# Patient Record
Sex: Female | Born: 1935 | Race: White | Hispanic: No | Smoking: Never smoker
Health system: Southern US, Community
[De-identification: ages and names within clinical notes are randomized; demographics above are authoritative.]

## PROBLEM LIST (undated history)

## (undated) DIAGNOSIS — E785 Hyperlipidemia, unspecified: Secondary | ICD-10-CM

## (undated) DIAGNOSIS — I4891 Unspecified atrial fibrillation: Secondary | ICD-10-CM

## (undated) DIAGNOSIS — I259 Chronic ischemic heart disease, unspecified: Secondary | ICD-10-CM

## (undated) DIAGNOSIS — I10 Essential (primary) hypertension: Secondary | ICD-10-CM

## (undated) DIAGNOSIS — F0390 Unspecified dementia without behavioral disturbance: Secondary | ICD-10-CM

## (undated) DIAGNOSIS — R531 Weakness: Secondary | ICD-10-CM

## (undated) DIAGNOSIS — F32A Depression, unspecified: Secondary | ICD-10-CM

## (undated) DIAGNOSIS — J9600 Acute respiratory failure, unspecified whether with hypoxia or hypercapnia: Secondary | ICD-10-CM

## (undated) DIAGNOSIS — N289 Disorder of kidney and ureter, unspecified: Secondary | ICD-10-CM

## (undated) DIAGNOSIS — K5900 Constipation, unspecified: Secondary | ICD-10-CM

## (undated) DIAGNOSIS — J40 Bronchitis, not specified as acute or chronic: Secondary | ICD-10-CM

## (undated) DIAGNOSIS — R609 Edema, unspecified: Secondary | ICD-10-CM

## (undated) DIAGNOSIS — R1310 Dysphagia, unspecified: Secondary | ICD-10-CM

## (undated) DIAGNOSIS — D649 Anemia, unspecified: Secondary | ICD-10-CM

## (undated) DIAGNOSIS — F329 Major depressive disorder, single episode, unspecified: Secondary | ICD-10-CM

---

## 2015-01-24 ENCOUNTER — Encounter (HOSPITAL_COMMUNITY): Payer: Self-pay | Admitting: Cardiology

## 2015-01-24 ENCOUNTER — Inpatient Hospital Stay (HOSPITAL_COMMUNITY)
Admission: EM | Admit: 2015-01-24 | Discharge: 2015-01-26 | DRG: 193 | Disposition: A | Discharge status: Skilled Nursing Facility | Payer: Medicare Other | Attending: Internal Medicine | Admitting: Internal Medicine

## 2015-01-24 ENCOUNTER — Emergency Department (HOSPITAL_COMMUNITY): Payer: Medicare Other

## 2015-01-24 DIAGNOSIS — E1122 Type 2 diabetes mellitus with diabetic chronic kidney disease: Secondary | ICD-10-CM | POA: Diagnosis present

## 2015-01-24 DIAGNOSIS — J189 Pneumonia, unspecified organism: Secondary | ICD-10-CM | POA: Diagnosis not present

## 2015-01-24 DIAGNOSIS — E785 Hyperlipidemia, unspecified: Secondary | ICD-10-CM | POA: Diagnosis present

## 2015-01-24 DIAGNOSIS — I129 Hypertensive chronic kidney disease with stage 1 through stage 4 chronic kidney disease, or unspecified chronic kidney disease: Secondary | ICD-10-CM | POA: Diagnosis present

## 2015-01-24 DIAGNOSIS — K921 Melena: Secondary | ICD-10-CM | POA: Diagnosis present

## 2015-01-24 DIAGNOSIS — Z993 Dependence on wheelchair: Secondary | ICD-10-CM | POA: Diagnosis not present

## 2015-01-24 DIAGNOSIS — N183 Chronic kidney disease, stage 3 (moderate): Secondary | ICD-10-CM | POA: Diagnosis present

## 2015-01-24 DIAGNOSIS — G9340 Encephalopathy, unspecified: Secondary | ICD-10-CM | POA: Diagnosis present

## 2015-01-24 DIAGNOSIS — Z681 Body mass index (BMI) 19 or less, adult: Secondary | ICD-10-CM

## 2015-01-24 DIAGNOSIS — N179 Acute kidney failure, unspecified: Secondary | ICD-10-CM | POA: Diagnosis not present

## 2015-01-24 DIAGNOSIS — N189 Chronic kidney disease, unspecified: Secondary | ICD-10-CM

## 2015-01-24 DIAGNOSIS — R32 Unspecified urinary incontinence: Secondary | ICD-10-CM | POA: Diagnosis present

## 2015-01-24 DIAGNOSIS — N390 Urinary tract infection, site not specified: Secondary | ICD-10-CM | POA: Diagnosis present

## 2015-01-24 DIAGNOSIS — E860 Dehydration: Secondary | ICD-10-CM | POA: Diagnosis present

## 2015-01-24 DIAGNOSIS — D649 Anemia, unspecified: Secondary | ICD-10-CM | POA: Insufficient documentation

## 2015-01-24 DIAGNOSIS — F0390 Unspecified dementia without behavioral disturbance: Secondary | ICD-10-CM | POA: Diagnosis present

## 2015-01-24 DIAGNOSIS — I4891 Unspecified atrial fibrillation: Secondary | ICD-10-CM | POA: Diagnosis present

## 2015-01-24 DIAGNOSIS — Z66 Do not resuscitate: Secondary | ICD-10-CM | POA: Diagnosis present

## 2015-01-24 DIAGNOSIS — Y95 Nosocomial condition: Secondary | ICD-10-CM | POA: Diagnosis present

## 2015-01-24 DIAGNOSIS — D631 Anemia in chronic kidney disease: Secondary | ICD-10-CM | POA: Insufficient documentation

## 2015-01-24 DIAGNOSIS — R410 Disorientation, unspecified: Secondary | ICD-10-CM

## 2015-01-24 DIAGNOSIS — N289 Disorder of kidney and ureter, unspecified: Secondary | ICD-10-CM

## 2015-01-24 DIAGNOSIS — E875 Hyperkalemia: Secondary | ICD-10-CM | POA: Diagnosis present

## 2015-01-24 DIAGNOSIS — N182 Chronic kidney disease, stage 2 (mild): Secondary | ICD-10-CM | POA: Diagnosis present

## 2015-01-24 DIAGNOSIS — G9341 Metabolic encephalopathy: Secondary | ICD-10-CM | POA: Diagnosis present

## 2015-01-24 DIAGNOSIS — E440 Moderate protein-calorie malnutrition: Secondary | ICD-10-CM | POA: Diagnosis present

## 2015-01-24 DIAGNOSIS — I482 Chronic atrial fibrillation, unspecified: Secondary | ICD-10-CM

## 2015-01-24 DIAGNOSIS — D696 Thrombocytopenia, unspecified: Secondary | ICD-10-CM | POA: Diagnosis present

## 2015-01-24 DIAGNOSIS — J9601 Acute respiratory failure with hypoxia: Secondary | ICD-10-CM | POA: Diagnosis present

## 2015-01-24 DIAGNOSIS — I10 Essential (primary) hypertension: Secondary | ICD-10-CM | POA: Diagnosis present

## 2015-01-24 HISTORY — DX: Essential (primary) hypertension: I10

## 2015-01-24 HISTORY — DX: Acute respiratory failure, unspecified whether with hypoxia or hypercapnia: J96.00

## 2015-01-24 HISTORY — DX: Chronic ischemic heart disease, unspecified: I25.9

## 2015-01-24 HISTORY — DX: Anemia, unspecified: D64.9

## 2015-01-24 HISTORY — DX: Weakness: R53.1

## 2015-01-24 HISTORY — DX: Dysphagia, unspecified: R13.10

## 2015-01-24 HISTORY — DX: Hyperlipidemia, unspecified: E78.5

## 2015-01-24 HISTORY — DX: Major depressive disorder, single episode, unspecified: F32.9

## 2015-01-24 HISTORY — DX: Depression, unspecified: F32.A

## 2015-01-24 HISTORY — DX: Unspecified atrial fibrillation: I48.91

## 2015-01-24 HISTORY — DX: Edema, unspecified: R60.9

## 2015-01-24 HISTORY — DX: Constipation, unspecified: K59.00

## 2015-01-24 HISTORY — DX: Disorder of kidney and ureter, unspecified: N28.9

## 2015-01-24 HISTORY — DX: Unspecified dementia, unspecified severity, without behavioral disturbance, psychotic disturbance, mood disturbance, and anxiety: F03.90

## 2015-01-24 HISTORY — DX: Bronchitis, not specified as acute or chronic: J40

## 2015-01-24 LAB — CBC WITH DIFFERENTIAL/PLATELET
BASOS PCT: 0 %
Basophils Absolute: 0 10*3/uL (ref 0.0–0.1)
EOS ABS: 0.2 10*3/uL (ref 0.0–0.7)
EOS PCT: 4 %
HCT: 44.8 % (ref 36.0–46.0)
Hemoglobin: 14.5 g/dL (ref 12.0–15.0)
Lymphocytes Relative: 18 %
Lymphs Abs: 0.8 10*3/uL (ref 0.7–4.0)
MCH: 32.1 pg (ref 26.0–34.0)
MCHC: 32.4 g/dL (ref 30.0–36.0)
MCV: 99.1 fL (ref 78.0–100.0)
MONO ABS: 0.5 10*3/uL (ref 0.1–1.0)
MONOS PCT: 11 %
Neutro Abs: 3.1 10*3/uL (ref 1.7–7.7)
Neutrophils Relative %: 68 %
Platelets: 98 10*3/uL — ABNORMAL LOW (ref 150–400)
RBC: 4.52 MIL/uL (ref 3.87–5.11)
RDW: 15.5 % (ref 11.5–15.5)
WBC: 4.6 10*3/uL (ref 4.0–10.5)

## 2015-01-24 LAB — COMPREHENSIVE METABOLIC PANEL
ALBUMIN: 3.3 g/dL — AB (ref 3.5–5.0)
ALT: 23 U/L (ref 14–54)
ANION GAP: 9 (ref 5–15)
AST: 37 U/L (ref 15–41)
Alkaline Phosphatase: 102 U/L (ref 38–126)
BUN: 49 mg/dL — ABNORMAL HIGH (ref 6–20)
CO2: 20 mmol/L — AB (ref 22–32)
Calcium: 8.7 mg/dL — ABNORMAL LOW (ref 8.9–10.3)
Chloride: 110 mmol/L (ref 101–111)
Creatinine, Ser: 1.79 mg/dL — ABNORMAL HIGH (ref 0.44–1.00)
GFR calc non Af Amer: 23 mL/min — ABNORMAL LOW (ref >60)
GFR, EST AFRICAN AMERICAN: 26 mL/min — AB (ref >60)
GLUCOSE: 94 mg/dL (ref 65–99)
POTASSIUM: 5.2 mmol/L — AB (ref 3.5–5.1)
SODIUM: 139 mmol/L (ref 135–145)
TOTAL PROTEIN: 7.1 g/dL (ref 6.5–8.1)
Total Bilirubin: 1.1 mg/dL (ref 0.3–1.2)

## 2015-01-24 LAB — URINALYSIS, ROUTINE W REFLEX MICROSCOPIC
BILIRUBIN URINE: NEGATIVE
Glucose, UA: NEGATIVE mg/dL
NITRITE: POSITIVE — AB
PH: 5.5 (ref 5.0–8.0)
Protein, ur: 30 mg/dL — AB
Specific Gravity, Urine: 1.025 (ref 1.005–1.030)
Urobilinogen, UA: 0.2 mg/dL (ref 0.0–1.0)

## 2015-01-24 LAB — TYPE AND SCREEN
ABO/RH(D): O NEG
ANTIBODY SCREEN: NEGATIVE

## 2015-01-24 LAB — URINE MICROSCOPIC-ADD ON

## 2015-01-24 LAB — I-STAT CG4 LACTIC ACID, ED: LACTIC ACID, VENOUS: 1.62 mmol/L (ref 0.5–2.0)

## 2015-01-24 LAB — TSH: TSH: 1.392 u[IU]/mL (ref 0.350–4.500)

## 2015-01-24 LAB — LACTIC ACID, PLASMA: Lactic Acid, Venous: 2.2 mmol/L (ref 0.5–2.0)

## 2015-01-24 LAB — POC OCCULT BLOOD, ED: Fecal Occult Bld: NEGATIVE

## 2015-01-24 MED ORDER — SODIUM CHLORIDE 0.9 % IV BOLUS (SEPSIS): 1000.0000 mL | Freq: Once | INTRAVENOUS | Status: DC

## 2015-01-24 MED ORDER — ACETAMINOPHEN 325 MG PO TABS: 650.0000 mg | ORAL_TABLET | Freq: Four times a day (QID) | ORAL | Status: DC | PRN

## 2015-01-24 MED ORDER — VANCOMYCIN HCL 500 MG IV SOLR
500.0000 mg | INTRAVENOUS | Status: DC
  Filled 2015-01-24: qty 500

## 2015-01-24 MED ORDER — VANCOMYCIN HCL IN DEXTROSE 750-5 MG/150ML-% IV SOLN
750.0000 mg | Freq: Once | INTRAVENOUS | Status: AC
  Administered 2015-01-24: 750 mg via INTRAVENOUS
  Filled 2015-01-24: qty 150

## 2015-01-24 MED ORDER — MAGNESIUM CITRATE PO SOLN: 1.0000 | Freq: Once | ORAL | Status: DC | PRN

## 2015-01-24 MED ORDER — ONDANSETRON HCL 4 MG/2ML IJ SOLN: 4.0000 mg | Freq: Four times a day (QID) | INTRAMUSCULAR | Status: DC | PRN

## 2015-01-24 MED ORDER — BISACODYL 10 MG RE SUPP: 10.0000 mg | Freq: Every day | RECTAL | Status: DC | PRN

## 2015-01-24 MED ORDER — SODIUM CHLORIDE 0.9 % IV BOLUS (SEPSIS): 500.0000 mL | Freq: Once | INTRAVENOUS | Status: DC

## 2015-01-24 MED ORDER — ACETAMINOPHEN 650 MG RE SUPP: 650.0000 mg | Freq: Four times a day (QID) | RECTAL | Status: DC | PRN

## 2015-01-24 MED ORDER — SODIUM CHLORIDE 0.9 % IV BOLUS (SEPSIS)
500.0000 mL | Freq: Once | INTRAVENOUS | Status: AC
  Administered 2015-01-24: 500 mL via INTRAVENOUS

## 2015-01-24 MED ORDER — PIPERACILLIN-TAZOBACTAM IN DEX 2-0.25 GM/50ML IV SOLN
2.2500 g | Freq: Three times a day (TID) | INTRAVENOUS | Status: DC
Start: 2015-01-24 — End: 2015-01-25
  Administered 2015-01-24 – 2015-01-25 (×2): 2.25 g via INTRAVENOUS
  Filled 2015-01-24 (×6): qty 50

## 2015-01-24 MED ORDER — SODIUM CHLORIDE 0.9 % IV SOLN
INTRAVENOUS | Status: DC
  Administered 2015-01-24: 16:00:00 via INTRAVENOUS

## 2015-01-24 MED ORDER — METOPROLOL TARTRATE 1 MG/ML IV SOLN
5.0000 mg | Freq: Three times a day (TID) | INTRAVENOUS | Status: DC
  Administered 2015-01-24 – 2015-01-25 (×3): 5 mg via INTRAVENOUS
  Filled 2015-01-24 (×3): qty 5

## 2015-01-24 MED ORDER — ONDANSETRON HCL 4 MG PO TABS: 4.0000 mg | ORAL_TABLET | Freq: Four times a day (QID) | ORAL | Status: DC | PRN

## 2015-01-24 MED ORDER — ALBUTEROL SULFATE (2.5 MG/3ML) 0.083% IN NEBU: 2.5000 mg | INHALATION_SOLUTION | Freq: Four times a day (QID) | RESPIRATORY_TRACT | Status: DC | PRN

## 2015-01-24 MED ORDER — PIPERACILLIN-TAZOBACTAM 3.375 G IVPB 30 MIN
3.3750 g | Freq: Once | INTRAVENOUS | Status: AC
  Administered 2015-01-24: 3.375 g via INTRAVENOUS
  Filled 2015-01-24: qty 50

## 2015-01-24 NOTE — Progress Notes (Signed)
Patient not able answer admission questions. No family present at this time. Attempt to feed patient patient held food in mouth.

## 2015-01-24 NOTE — ED Notes (Addendum)
Vancomycin infusion completed.

## 2015-01-24 NOTE — ED Provider Notes (Signed)
Of the CSN: [id number]     Arrival date & time 01/24/15  1028 History  By signing my name below, I, Marica Otter, attest that this documentation has been prepared under the direction and in the presence of Estela Y Hernandez Acost*. Electronically Signed: Marica Otter, ED Scribe. 01/24/2015. 11:47 AM.   LEVEL 5 CAVEAT: DEMENTIA  Chief Complaint  Patient presents with  . GI Bleeding   The history is provided by the nursing home. No language interpreter was used.   PCP: No primary care provider on file. HPI Comments: Ashley Singh is a 81 y.o. female, with PMHx noted below, who presents to the Emergency Department from a complaining of hematuria this morning.   Past Medical History  Diagnosis Date  . Anemia   . Bronchitis   . Renal disorder   . Edema   . Hypertension   . Hyperlipidemia   . Depressive disorder   . Atrial fibrillation   . Ischemia of heart, chronic   . Constipation   . Dysphagia   . Dementia   . Weakness   . Acute respiratory failure    History reviewed. No pertinent past surgical history. History reviewed. No pertinent family history. Social History  Substance Use Topics  . Smoking status: None  . Smokeless tobacco: None  . Alcohol Use: No   OB History    No data available     Review of Systems  Unable to perform ROS: Mental status change   Allergies  Review of patient's allergies indicates no known allergies.  Home Medications   Prior to Admission medications   Not on File   Triage Vitals: BP 162/91 mmHg  Pulse 102  Temp(Src) 96.6 F (35.9 C) (Rectal)  Resp 16  Ht 5' (1.524 m)  Wt 95 lb (43.092 kg)  BMI 18.55 kg/m2  SpO2 86% Physical Exam  Constitutional: She appears well-developed. No distress.  HENT:  Head: Normocephalic.  Mouth/Throat: Mucous membranes are dry.  Eyes: EOM are normal.  Eyes tightly closed.   Neck: Normal range of motion. Neck supple.  Cardiovascular: Regular rhythm.  Tachycardia present.   Pulmonary/Chest:  Effort normal. She has no rales.  Some abd breathing. Decreased effort.   Abdominal: She exhibits no distension.  Musculoskeletal: She exhibits no edema.  Flexed at the legs and arms.   Neurological: GCS eye subscore is 3. GCS verbal subscore is 3. GCS motor subscore is 5.  Confused Minimal verbal, difficulty exam, good tone muscles  Skin: No erythema.  Psychiatric:  confused  Nursing note and vitals reviewed.   ED Course  Procedures (including critical care time) DIAGNOSTIC STUDIES: Oxygen Saturation is 86% on RA, low by my interpretation.    Labs Review Labs Reviewed  CBC WITH DIFFERENTIAL/PLATELET - Abnormal; Notable for the following:    Platelets 98 (*)    All other components within normal limits  COMPREHENSIVE METABOLIC PANEL - Abnormal; Notable for the following:    Potassium 5.2 (*)    CO2 20 (*)    BUN 49 (*)    Creatinine, Ser 1.79 (*)    Calcium 8.7 (*)    Albumin 3.3 (*)    GFR calc non Af Amer 23 (*)    GFR calc Af Amer 26 (*)    All other components within normal limits  URINALYSIS, ROUTINE W REFLEX MICROSCOPIC (NOT AT [facility]) - Abnormal; Notable for the following:    Hgb urine dipstick MODERATE (*)    Ketones, ur TRACE (*)  Protein, ur 30 (*)    Nitrite POSITIVE (*)    Leukocytes, UA TRACE (*)    All other components within normal limits  URINE MICROSCOPIC-ADD ON - Abnormal; Notable for the following:    Squamous Epithelial / LPF FEW (*)    Bacteria, UA MANY (*)    All other components within normal limits  LACTIC ACID, PLASMA - Abnormal; Notable for the following:    Lactic Acid, Venous 2.2 (*)    All other components within normal limits  URINE CULTURE  CULTURE, BLOOD (ROUTINE X 2)  CULTURE, BLOOD (ROUTINE X 2)  CULTURE, EXPECTORATED SPUTUM-ASSESSMENT  GRAM STAIN  URINE CULTURE  LEGIONELLA PNEUMOPHILA SEROGP 1 UR AG  STREP PNEUMONIAE URINARY ANTIGEN  TSH  POC OCCULT BLOOD, ED  POC OCCULT BLOOD, ED  I-STAT CG4 LACTIC ACID, ED  TYPE AND  SCREEN    Imaging Review Dg Chest Portable 1 View  01/24/2015   CLINICAL DATA:  Found down today. Dementia with GI bleeding and urinary tract infection. Initial encounter.  EXAM: PORTABLE CHEST 1 VIEW  COMPARISON:  None.  FINDINGS: 1157 hours. The lung bases are excluded from this portable examination. Patient is rotated to the right. The heart is enlarged and there is mild aortic atherosclerosis and ectasia. There are probable bilateral pleural effusions with asymmetric perihilar airspace disease on the right. No evidence of pneumothorax. The bones are demineralized without acute findings.  IMPRESSION: Asymmetric perihilar airspace disease on the right may reflect pneumonia or focal atelectasis. Suspected bilateral pleural effusions with associated basilar atelectasis and mild cardiomegaly. Followup PA and lateral chest X-ray is recommended in 3-4 weeks following trial of antibiotic therapy to ensure resolution and exclude underlying malignancy.   Electronically Signed   By: Carey Bullocks M.D.   On: 01/24/2015 12:11   I have personally reviewed and evaluated these images and lab results as part of my medical decision-making.   EKG Interpretation None      MDM   Final diagnoses:  Confusion  Blood in stool  Acute renal failure, unspecified acute renal failure type  HCAP (healthcare-associated pneumonia)   Patient presents with worsening confusion and blood from stool or urine. Patient had recent urine infection diagnosis. On arrival patient had periods of apnea and increased tachypnea. Concern for urine infection versus pneumonia. Chest x-ray reviewed by myself concerning for right-sided pneumonia. Hospital acquired antibody X. Urinalysis pending. Discussed with hospitalist for admission, patient is DO NOT RESUSCITATE. Patient on 3 L nasal cannula.  The patients results and plan were reviewed and discussed.   Any x-rays performed were independently reviewed by myself.   Differential  diagnosis were considered with the presenting HPI.  Medications  sodium chloride 0.9 % bolus 500 mL (0 mLs Intravenous Stopped 01/24/15 1347)  0.9 %  sodium chloride infusion (not administered)  acetaminophen (TYLENOL) tablet 650 mg (not administered)    Or  acetaminophen (TYLENOL) suppository 650 mg (not administered)  bisacodyl (DULCOLAX) suppository 10 mg (not administered)  magnesium citrate solution 1 Bottle (not administered)  ondansetron (ZOFRAN) tablet 4 mg (not administered)    Or  ondansetron (ZOFRAN) injection 4 mg (not administered)  metoprolol (LOPRESSOR) injection 5 mg (not administered)  piperacillin-tazobactam (ZOSYN) IVPB 2.25 g (not administered)  piperacillin-tazobactam (ZOSYN) IVPB 3.375 g (0 g Intravenous Stopped 01/24/15 1346)  vancomycin (VANCOCIN) IVPB 750 mg/150 ml premix (750 mg Intravenous Given 01/24/15 1345)  sodium chloride 0.9 % bolus 500 mL (500 mLs Intravenous Bolus from Bag 01/24/15 1309)  Filed Vitals:   01/24/15 1445 01/24/15 1446 01/24/15 1449 01/24/15 1517  BP:    148/78  Pulse:    107  Temp:    98.1 F (36.7 C)  TempSrc:    Oral  Resp: Height:     (1.651 m)  Weight:    96 lb 9.6 oz (43.817 kg)  SpO2: 93% 96% 95% 96%    Final diagnoses:  Confusion  Blood in stool  Acute renal failure, unspecified acute renal failure type  HCAP (healthcare-associated pneumonia)    Admission/ observation were discussed with the admitting physician, patient and/or family and they are comfortable with the plan.    Blane Ohara, MD 01/24/15 [phone]

## 2015-01-24 NOTE — ED Notes (Signed)
Pt has very irregular respirations with periods of apnea.  Pt resting with eyes shut.  Wakes to a sternal rub.  Pt arrived on nasal cannula 4 liters with sats in the 80'S.  Increased oxygen to non rebreather.  Yellow  DNR  Paper at bedside.

## 2015-01-24 NOTE — ED Notes (Signed)
Pt taken off the NRB and placed O2 via [place] at 3 L/M, pt's sats 100 %

## 2015-01-24 NOTE — Progress Notes (Signed)
CRITICAL VALUE ALERT  Critical value received: lactic acid 2.2  Date of notification: 01/24/15  Time of notification: 1510  Critical value read back:Yes.    Nurse who received alert:  Roe Coombs  MD notified (1st page):yes  Time of first page: 1530  MD notified (2nd page):  Time of second page:  Responding MD: Dr. Ardyth Harps Time MD responded: [phone]

## 2015-01-24 NOTE — ED Notes (Signed)
Found at 830 am this morning laying in bed in some stool.  The stool had blood present.  Hemoccult positive.  Being treated for an UTI.  History of dementia.  Per staff pt is not as active this morning.

## 2015-01-24 NOTE — Progress Notes (Signed)
ANTIBIOTIC CONSULT NOTE - INITIAL  Pharmacy Consult for Vancomycin Indication: pneumonia  No Known Allergies  Patient Measurements: Height:  (165.1 cm) Weight: 96 lb 9.6 oz (43.817 kg) IBW/kg (Calculated) : 57   Vital Signs: Temp: 98.1 F (36.7 C) (09/27 1517) Temp Source: Oral (09/27 1517) BP: 148/78 mmHg (09/27 1517) Pulse Rate: 107 (09/27 1517) Intake/Output from previous day:   Intake/Output from this shift:    Labs:  Recent Labs  01/24/15 1111  WBC 4.6  HGB 14.5  PLT 98*  CREATININE 1.79*   Estimated Creatinine Clearance: 12.4 mL/min (by C-G formula based on Cr of 1.79). No results for input(s): VANCOTROUGH, VANCOPEAK, VANCORANDOM, GENTTROUGH, GENTPEAK, GENTRANDOM, TOBRATROUGH, TOBRAPEAK, TOBRARND, AMIKACINPEAK, AMIKACINTROU, AMIKACIN in the last 72 hours.   Microbiology: No results found for this or any previous visit (from the past 720 hour(s)).  Medical History: Past Medical History  Diagnosis Date  . Anemia   . Bronchitis   . Renal disorder   . Edema   . Hypertension   . Hyperlipidemia   . Depressive disorder   . Atrial fibrillation   . Ischemia of heart, chronic   . Constipation   . Dysphagia   . Dementia   . Weakness   . Acute respiratory failure     Medications:  Awaiting fax of med list Assessment: 81 yo female resident of the [facility] [id number]).  She is demented. Admitted with Acute respiratory failure with hypoxia(HCAP), UTI, and dehydration.   Goal of Therapy:  Vancomycin trough level 15-20 mcg/ml  Plan:  Vancomycin  iv loading dose, then  IV q48h Zosyn 2.25gm IV q8h Expected duration 7 days with resolution of temperature and/or normalization of WBC Measure antibiotic drug levels at steady state Follow up culture results  Elder Cyphers, BS Loura Back, BCPS Clinical Pharmacist Pager [phone]   01/24/2015,4:21 PM

## 2015-01-24 NOTE — ED Notes (Signed)
Called and spoke with son on the phone.  States he is on the way.

## 2015-01-24 NOTE — H&P (Signed)
[facility] History and Physical  Ashley Singh ZOX:[id number] DOB: Dec 01, 1914 DOA: 01/24/2015  Referring physician:  PCP: No primary care Ashley Singh on file.   Chief Complaint: lethargy  HPI: Ashley Singh is a 81 y.o. female with a past medical history that includes A. fib, hypertension, dementia, anemia presents to the emergency department with the chief complaint encephalopathy. Initial evaluation in the emergency department reveals healthcare associated pneumonia, urinary tract infection, acute respiratory failure.  Information is obtained from the son who is at the bedside in the chart. Patient is a resident of long-term nursing facility [facility]. Yesterday she was diagnosed with a urinary tract infection and antibiotic source started. This morning patient was much more lethargic than usual found to be incontinent of bowel which is not her norm. Information states some blood was noted in the stool he MS was called and her oxygen saturation level was in the 80s. There is no report of recent fever chills nausea vomiting diarrhea. No report of any complaints of pain however the patient is unable to make her wants and needs known. There has been been no shortness of breath no coughing. Workup in the emergency department includes a chest x-ray suspicious for pneumonia/atelectasis/pleural effusion. Lab work significant for potassium of 5.2 reactive and of 1.79 platelets 98, lactic acid 1.62, analysis consistent with UTI, FOBT positive.  In the emergency department antibiotic initiated, she is also provided with 500 metabolic normal saline and oxygen supplementation.. She is afebrile hemodynamically stable with an oxygen saturation level of 86% on room air    Review of Systems:  Unable to review of systems due to dementia and acute illness see history of present illness  Past Medical History  Diagnosis Date  . Anemia   . Bronchitis   . Renal disorder   . Edema   . Hypertension   .  Hyperlipidemia   . Depressive disorder   . Atrial fibrillation   . Ischemia of heart, chronic   . Constipation   . Dysphagia   . Dementia   . Weakness   . Acute respiratory failure    History reviewed. No pertinent past surgical history. Social History:  reports that she does not drink alcohol. Her tobacco and drug histories are not on file. Patient resident of [facility] and has been so for the last year she was moved here from [place] by her family. She is wheelchair-bound. She is quite demented in that she does not know where she is she is unable to make her wants and needs known. She will feed herself 1 set up that she is incontinent of urine and bowel No Known Allergies  History reviewed. No pertinent family history. family medical history reviewed and noncontributory to the admission of this elderly lady  Prior to Admission medications   Not on File   Physical Exam: Filed Vitals:   01/24/15 1035 01/24/15 1111 01/24/15 1130 01/24/15 1158  BP: 162/91  138/86   Pulse: 102     Temp: 96.6 F (35.9 C)     TempSrc: Rectal     Resp: Height: 5' (1.524 m)     Weight: 43.092 kg (95 lb)     SpO2: 86% 89%  94%    Wt Readings from Last 3 Encounters:  01/24/15 43.092 kg (95 lb)    General:  Somewhat pale somewhat frail but appears comparable somewhat lethargic Eyes: PERRL, normal lids, irises & conjunctiva ENT: grossly normal hearing, mucous membranes of her  mouth are pale and slightly dry Neck: no LAD, masses or thyromegaly Cardiovascular: Active chronic but regular no m/r/g. No LE edema. Pulses present and palpable Telemetry: SR, no arrhythmias  Respiratory:  Normal respiratory effort. Somewhat shallow breath sounds quite diminished no wheezes no crackles Abdomen: soft, ntnd no guarding or rebounding Skin: no rash or induration seen on limited exam Musculoskeletal: grossly normal tone BUE/BLE Psychiatric: grossly normal mood and affect, speech fluent and  appropriate Neurologic: grossly non-focal. He woke up and her eyes to verbal stimuli she will answer yes to every question asked she attempts to follow commands but is unable. She does move extremities spontaneously.           Labs on Admission:  Basic Metabolic Panel:  Recent Labs Lab 01/24/15 1111  NA 139  K 5.2*  CL 110  CO2 20*  GLUCOSE 94  BUN 49*  CREATININE 1.79*  CALCIUM 8.7*   Liver Function Tests:  Recent Labs Lab 01/24/15 1111  AST 37  ALT 23  ALKPHOS 102  BILITOT 1.1  PROT 7.1  ALBUMIN 3.3*   No results for input(s): LIPASE, AMYLASE in the last 168 hours. No results for input(s): AMMONIA in the last 168 hours. CBC:  Recent Labs Lab 01/24/15 1111  WBC 4.6  NEUTROABS 3.1  HGB 14.5  HCT 44.8  MCV 99.1  PLT 98*   Cardiac Enzymes: No results for input(s): CKTOTAL, CKMB, CKMBINDEX, TROPONINI in the last 168 hours.  BNP (last 3 results) No results for input(s): BNP in the last 8760 hours.  ProBNP (last 3 results) No results for input(s): PROBNP in the last 8760 hours.  CBG: No results for input(s): GLUCAP in the last 168 hours.  Radiological Exams on Admission: Dg Chest Portable 1 View  01/24/2015   CLINICAL DATA:  Found down today. Dementia with GI bleeding and urinary tract infection. Initial encounter.  EXAM: PORTABLE CHEST 1 VIEW  COMPARISON:  None.  FINDINGS: 1157 hours. The lung bases are excluded from this portable examination. Patient is rotated to the right. The heart is enlarged and there is mild aortic atherosclerosis and ectasia. There are probable bilateral pleural effusions with asymmetric perihilar airspace disease on the right. No evidence of pneumothorax. The bones are demineralized without acute findings.  IMPRESSION: Asymmetric perihilar airspace disease on the right may reflect pneumonia or focal atelectasis. Suspected bilateral pleural effusions with associated basilar atelectasis and mild cardiomegaly. Followup PA and lateral  chest X-ray is recommended in 3-4 weeks following trial of antibiotic therapy to ensure resolution and exclude underlying malignancy.   Electronically Signed   By: Carey Bullocks M.D.   On: 01/24/2015 12:11    EKG: Independently reviewed. Will sinus rhythm  Assessment/Plan Principal Problem:  Acute respiratory failure with hypoxia: Secondary to increased lethargy in the setting of healthcare associated pneumonia and urinary tract infection and dehydration. Oxygen saturation level 100% on nonrebreather. Provide nebulizers, antibiotic and continue oxygen supplementation as indicated.    Encephalopathy: Acute on chronic secondary to infectious process in the setting of dementia. Will admit to medical floor. Will provide gentle IV fluids she appears somewhat dehydrated and broad-spectrum antibiotic for urinary tract infection and healthcare associated pneumonia. Currently afebrile and nontoxic appearing will monitor  Active Problems:    HCAP (healthcare-associated pneumonia): Per chest x-ray. See #1. vancomycin and Zosyn per pharmacy. Sputum culture as able. Will obtain strep pneumo urine antigen as well as Legionella urine antigen. Currently afebrile nontoxic appearing and hemodynamically stable  CKD II:  Probably an acute component as well. Unclear what baseline creatinine is. Will request [facility] records. Reportedly patient has taken very little by mouth over the last 2 days. Will provide gentle IV fluids. Hold any nephrotoxins. Monitor urine output. If no improvement consider renal ultrasound    Hyperkalemia: Mild. Likely related to #4. Will monitor.    Thrombocytopenia: Monitor. Low end of normal. Some report of bloody stool. Otherwise no signs symptoms of bleeding. Hematoma and 14.5. Will monitor will use SCDs for DVT prophylaxis     Atrial fibrillation: EKG was normal sinus rhythm. Home medications include Imdur, metoprolol will hold these for now as patient is too lethargic. Currently on  Plavix in spite of reports of recent falling. Will hold this for now. Will provide IV beta blocker with parameters.    Dementia: See #2.    Hypertension: Blood pressure on the high end of normal on admission. Will provide beta blocker IV with parameters. She is also on amlodipine  Hematochezia: Facility reports blood in stool. FOBT positive. Hemoglobin is stable. Will monitor. Consider outpatient follow-up  none  Code Status: DNR DVT Prophylaxis:SCD's Family Communication: sone at bedside Disposition Plan: back to facility  Time spent: 65 minutes  [facility] Pager [phone]

## 2015-01-25 ENCOUNTER — Encounter (HOSPITAL_COMMUNITY): Payer: Self-pay | Admitting: *Deleted

## 2015-01-25 LAB — MRSA PCR SCREENING: MRSA by PCR: NEGATIVE

## 2015-01-25 LAB — URINE CULTURE

## 2015-01-25 LAB — COMPREHENSIVE METABOLIC PANEL
ALK PHOS: 80 U/L (ref 38–126)
ALT: 17 U/L (ref 14–54)
ANION GAP: 9 (ref 5–15)
AST: 28 U/L (ref 15–41)
Albumin: 2.9 g/dL — ABNORMAL LOW (ref 3.5–5.0)
BILIRUBIN TOTAL: 1.4 mg/dL — AB (ref 0.3–1.2)
BUN: 49 mg/dL — ABNORMAL HIGH (ref 6–20)
CALCIUM: 8.2 mg/dL — AB (ref 8.9–10.3)
CO2: 17 mmol/L — ABNORMAL LOW (ref 22–32)
Chloride: 114 mmol/L — ABNORMAL HIGH (ref 101–111)
Creatinine, Ser: 1.78 mg/dL — ABNORMAL HIGH (ref 0.44–1.00)
GFR, EST AFRICAN AMERICAN: 26 mL/min — AB (ref >60)
GFR, EST NON AFRICAN AMERICAN: 23 mL/min — AB (ref >60)
GLUCOSE: 83 mg/dL (ref 65–99)
POTASSIUM: 4.9 mmol/L (ref 3.5–5.1)
Sodium: 140 mmol/L (ref 135–145)
TOTAL PROTEIN: 6.3 g/dL — AB (ref 6.5–8.1)

## 2015-01-25 LAB — CBC
HEMATOCRIT: 44.2 % (ref 36.0–46.0)
HEMOGLOBIN: 14.3 g/dL (ref 12.0–15.0)
MCH: 32.4 pg (ref 26.0–34.0)
MCHC: 32.4 g/dL (ref 30.0–36.0)
MCV: 100 fL (ref 78.0–100.0)
Platelets: 86 10*3/uL — ABNORMAL LOW (ref 150–400)
RBC: 4.42 MIL/uL (ref 3.87–5.11)
RDW: 15.6 % — ABNORMAL HIGH (ref 11.5–15.5)
WBC: 3.9 10*3/uL — AB (ref 4.0–10.5)

## 2015-01-25 LAB — LACTIC ACID, PLASMA
LACTIC ACID, VENOUS: 2 mmol/L (ref 0.5–2.0)
LACTIC ACID, VENOUS: 2.3 mmol/L — AB (ref 0.5–2.0)

## 2015-01-25 MED ORDER — SODIUM CHLORIDE 0.9 % IV SOLN
INTRAVENOUS | Status: AC
  Administered 2015-01-25: 12:00:00 via INTRAVENOUS

## 2015-01-25 MED ORDER — CITALOPRAM HYDROBROMIDE 20 MG PO TABS
20.0000 mg | ORAL_TABLET | Freq: Every day | ORAL | Status: DC
  Administered 2015-01-25 – 2015-01-26 (×2): 20 mg via ORAL
  Filled 2015-01-25 (×2): qty 1

## 2015-01-25 MED ORDER — CLOPIDOGREL BISULFATE 75 MG PO TABS
75.0000 mg | ORAL_TABLET | Freq: Every day | ORAL | Status: DC
  Administered 2015-01-25 – 2015-01-26 (×2): 75 mg via ORAL
  Filled 2015-01-25 (×2): qty 1

## 2015-01-25 MED ORDER — ENSURE ENLIVE PO LIQD
237.0000 mL | Freq: Two times a day (BID) | ORAL | Status: DC
  Administered 2015-01-25 – 2015-01-26 (×2): 237 mL via ORAL

## 2015-01-25 MED ORDER — METOPROLOL SUCCINATE ER 25 MG PO TB24
25.0000 mg | ORAL_TABLET | Freq: Every day | ORAL | Status: DC
  Administered 2015-01-25 – 2015-01-26 (×2): 25 mg via ORAL
  Filled 2015-01-25 (×2): qty 1

## 2015-01-25 MED ORDER — PIPERACILLIN-TAZOBACTAM IN DEX 2-0.25 GM/50ML IV SOLN: 2.2500 g | Freq: Three times a day (TID) | INTRAVENOUS | Status: DC

## 2015-01-25 MED ORDER — METOPROLOL TARTRATE 1 MG/ML IV SOLN: 5.0000 mg | Freq: Four times a day (QID) | INTRAVENOUS | Status: DC

## 2015-01-25 MED ORDER — PIPERACILLIN-TAZOBACTAM IN DEX 2-0.25 GM/50ML IV SOLN
2.2500 g | Freq: Three times a day (TID) | INTRAVENOUS | Status: DC
  Filled 2015-01-25 (×4): qty 50

## 2015-01-25 MED ORDER — ISOSORBIDE DINITRATE 20 MG PO TABS
10.0000 mg | ORAL_TABLET | Freq: Two times a day (BID) | ORAL | Status: DC
  Administered 2015-01-25 – 2015-01-26 (×3): 10 mg via ORAL
  Filled 2015-01-25 (×3): qty 1

## 2015-01-25 MED ORDER — PIPERACILLIN SOD-TAZOBACTAM SO 2.25 (2-0.25) G IV SOLR
2.2500 g | Freq: Three times a day (TID) | INTRAVENOUS | Status: DC
  Administered 2015-01-25 – 2015-01-26 (×3): 2.25 g via INTRAVENOUS
  Filled 2015-01-25 (×7): qty 2.25

## 2015-01-25 MED ORDER — MIRTAZAPINE 15 MG PO TABS
7.5000 mg | ORAL_TABLET | Freq: Every day | ORAL | Status: DC
  Administered 2015-01-25: 7.5 mg via ORAL
  Filled 2015-01-25: qty 1

## 2015-01-25 NOTE — Progress Notes (Signed)
1149 Lab called to report lactic acid 2.3, NP notified.

## 2015-01-25 NOTE — Progress Notes (Signed)
[facility] PROGRESS NOTE  Ashley Singh ZOX:[id number] DOB: [birth year] DOA: 01/24/2015 PCP: No primary care provider on file.  Assessment/Plan: Acute respiratory failure with hypoxia: Secondary to increased lethargy in the setting of healthcare associated pneumonia and urinary tract infection and dehydration. Much improved. Oxygen saturation level 96% on 2L. Will wean. continue nebulizers, antibiotic.    Encephalopathy: Acute on chronic secondary to infectious process in the setting of dementia. More alert this am. Closer to baseline. Continue gentle IV fluids will decrease rate. Antibiotics as above. Will resume home oral medications. Blood culture and urine culture pending. Remains afebrile and nontoxic appearing will monitor  Active Problems:   HCAP (healthcare-associated pneumonia): Per chest x-ray. See #1. vancomycin and Zosyn per pharmacy day #2. Sputum culture as able. Await strep pneumo urine antigen as well as Legionella urine antigen. remains afebrile nontoxic appearing and hemodynamically stable  CKD II: stable. May be an acute component as well. await medical records. continue gentle IV fluids. Hold any nephrotoxins.    Hyperkalemia: Mild. Resolved. continue monitor.   Thrombocytopenia: etiology unclear.  no signs symptoms of bleeding. Hg 14.3. Will monitor will use SCDs for DVT prophylaxis   Atrial fibrillation: EKG was normal sinus rhythm. Home medications include IIsordil, metoprolol. will resume. Currently on Plavix in spite of reports of recent falling. Will resume.   Dementia: See #2.   Hypertension: Blood pressure remains on high end of normal on admission. Will resume home BB.  Hematochezia: Facility reports blood in stool. FOBT positive. Hemoglobin is stable. Will monitor. Consider outpatient follow-up   Code Status: DNR Family Communication: none present Disposition Plan: back to facility hopefully  tomorrow   Consultants:  none  Procedures:  none  Antibiotics:  Vancomycin 01/24/15>>  Zosyn 01/24/15>>  HPI/Subjective: More alert. Smiles and says "yes" to all question. More verbal today  Objective: Filed Vitals:   01/25/15 0521  BP: 148/94  Pulse: 86  Temp: 97.9 F (36.6 C)  Resp: 16    Intake/Output Summary (Last 24 hours) at 01/25/15 0839 Last data filed at 01/24/15 1800  Gross per 24 hour  Intake      0 ml  Output      0 ml  Net      0 ml   Filed Weights   01/24/15 1035 01/24/15 1517  Weight: 43.092 kg (95 lb) 43.817 kg (96 lb 9.6 oz)    Exam:   General:  Somewhat thin and pale and frail  Cardiovascular: rrr no MGR no LE edema  Respiratory: normal effort BS clear bilaterally  Abdomen: non-distended non-tender +BS  Musculoskeletal: joints without swelling/erythema   Data Reviewed: Basic Metabolic Panel:  Recent Labs Lab 01/24/15 1111  NA 139  K 5.2*  CL 110  CO2 20*  GLUCOSE 94  BUN 49*  CREATININE 1.79*  CALCIUM 8.7*   Liver Function Tests:  Recent Labs Lab 01/24/15 1111  AST 37  ALT 23  ALKPHOS 102  BILITOT 1.1  PROT 7.1  ALBUMIN 3.3*   No results for input(s): LIPASE, AMYLASE in the last 168 hours. No results for input(s): AMMONIA in the last 168 hours. CBC:  Recent Labs Lab 01/24/15 1111 01/25/15 0743  WBC 4.6 3.9*  NEUTROABS 3.1  --   HGB 14.5 14.3  HCT 44.8 44.2  MCV 99.1 100.0  PLT 98* 86*   Cardiac Enzymes: No results for input(s): CKTOTAL, CKMB, CKMBINDEX, TROPONINI in the last 168 hours. BNP (last 3 results) No results for input(s): BNP in the last  8760 hours.  ProBNP (last 3 results) No results for input(s): PROBNP in the last 8760 hours.  CBG: No results for input(s): GLUCAP in the last 168 hours.  Recent Results (from the past 240 hour(s))  MRSA PCR Screening     Status: None   Collection Time: 01/25/15  2:40 AM  Result Value Ref Range Status   MRSA by PCR NEGATIVE NEGATIVE Final     Comment:        The GeneXpert MRSA Assay (FDA approved for NASAL specimens only), is one component of a comprehensive MRSA colonization surveillance program. It is not intended to diagnose MRSA infection nor to guide or monitor treatment for MRSA infections.      Studies: Dg Chest Portable 1 View  01/24/2015   CLINICAL DATA:  Found down today. Dementia with GI bleeding and urinary tract infection. Initial encounter.  EXAM: PORTABLE CHEST 1 VIEW  COMPARISON:  None.  FINDINGS: 1157 hours. The lung bases are excluded from this portable examination. Patient is rotated to the right. The heart is enlarged and there is mild aortic atherosclerosis and ectasia. There are probable bilateral pleural effusions with asymmetric perihilar airspace disease on the right. No evidence of pneumothorax. The bones are demineralized without acute findings.  IMPRESSION: Asymmetric perihilar airspace disease on the right may reflect pneumonia or focal atelectasis. Suspected bilateral pleural effusions with associated basilar atelectasis and mild cardiomegaly. Followup PA and lateral chest X-ray is recommended in 3-4 weeks following trial of antibiotic therapy to ensure resolution and exclude underlying malignancy.   Electronically Signed   By: Carey Bullocks M.D.   On: 01/24/2015 12:11    Scheduled Meds: . metoprolol  5 mg Intravenous 4 times per day  . piperacillin-tazobactam (ZOSYN)  IV  2.25 g Intravenous 3 times per day  . [START ON 01/26/2015] vancomycin  500 mg Intravenous Q48H   Continuous Infusions: . sodium chloride 75 mL/hr at 01/24/15 1623    Principal Problem:   Acute respiratory failure with hypoxia Active Problems:   HCAP (healthcare-associated pneumonia)   Encephalopathy   Atrial fibrillation   Dementia   Hypertension   Renal disorder   Hyperkalemia   Thrombocytopenia   Hematochezia   Acute renal failure syndrome    Time spent: 35 minutes    [facility]  [facility] Pager [phone]. If 7PM-7AM, please contact night-coverage at [email or web address], password [facility] 01/25/2015, 8:39 AM  LOS: 1 day

## 2015-01-25 NOTE — Progress Notes (Signed)
Initial Nutrition Assessment  DOCUMENTATION CODES:   Non-severe (moderate) malnutrition in context of acute illness/injury, Underweight  INTERVENTION:  - Order Ensure Enlive BID with meals (each supplement contains 350 kcal and 20 g protein).  - Recommend speech consult per chart note of patient holding food in mouth and previous history of dysphagia.  - Continue to monitor patient for needs.    NUTRITION DIAGNOSIS:   Malnutrition related to lethargy/confusion as evidenced by meal completion < 25%, moderate depletion of body fat, severe depletion of muscle mass.    GOAL:   Patient will meet greater than or equal to 90% of their needs   MONITOR:   PO intake, Supplement acceptance, Labs, Weight trends, Skin, I & O's  REASON FOR ASSESSMENT:   Malnutrition Screening Tool    ASSESSMENT:   81 yr old female with acute metabolic encephalopathy from HCAP, UTI and acute respiratory failure with hypoxia, admitted from long term care facility. Patient has previous medical history of A.Fib, HTN, Dementia, anemia, renal disease, Bronchitis, HTN, ischemia, weakness and dysphagia.   Patient was sitting in room watching TV at time of visit for MST. She was sleepy throughout the visit. Patient has BMI of 16.1, underweight. She was not able to provide historical information due to confusion and there was no family present.  Per nurse, patient's meal completion has been > 25% for the past few days. Per chart note, she had poor PO intake two days prior to arriving at hospital. Patient is unable to eat by herself and has requires feeding assistance at meals. Per chart, she was holding food in mouth and has a previous history of dysphagia. Recommend that patient be seen by Speech to assess.   Results from the nutrition focused physical exam indicated moderate muscle and moderate fat wasting. Patient meets criteria for malnutrition due to acute illness. RD student will order Ensure Enlive BID with  meals for patient to support nutritional intake.   Medications Reviewed.  Labs reviewed: Cl high, CO2 low, BUN high, Cr high, Ca low, Bilirubin high, WBC low, RDW high, platelets low, very high lactic acid, venous, ketones in urine.   Diet Order:  Diet Heart Room service appropriate?: Yes; Fluid consistency:: Thin  Skin:  Reviewed, no issues  Last BM:  01/24/2015  Height:   Ht Readings from Last 1 Encounters:  01/24/15  (1.651 m)    Weight:   Wt Readings from Last 1 Encounters:  01/24/15 96 lb 9.6 oz (43.817 kg)    Ideal Body Weight:  47.7 kg  BMI:  Body mass index is 16.07 kg/(m^2).  Estimated Nutritional Needs:   Kcal:  1300-1500 kcal  Protein:  55-65 g   Fluid:  1.5 L/day  EDUCATION NEEDS:   No education needs identified at this time  Delano Metz, BA. BS. Dietetic Intern

## 2015-01-25 NOTE — Clinical Social Work Note (Signed)
Clinical Social Work Assessment  Patient Details  Name: Ashley Singh MRN: [id number] Date of Birth: [birth year]  Date of referral:  01/25/15               Reason for consult:  Facility Placement                Permission sought to share information with:    Permission granted to share information::     Name::        Agency::     Relationship::     Contact Information:     Housing/Transportation Living arrangements for the past 2 months:  Skilled Building surveyor of Information:  Adult Children Patient Interpreter Needed:  None Criminal Activity/Legal Involvement Pertinent to Current Situation/Hospitalization:  No - Comment as needed Significant Relationships:  Adult Children Lives with:  Facility Resident Do you feel safe going back to the place where you live?  Yes Need for family participation in patient care:  Yes (Comment)  Care giving concerns:  Pt is long term resident at [facility].    Social Worker assessment / plan:  CSW spoke with pt's son, Ashley Singh on phone as pt is oriented to self only. Pt has been a resident at [facility] for over a year. William's wife is Production designer, theatre/television/film there. They live in [place], but Ashley Singh reports he sees pt several times a week. He indicates things are going well at facility and requests return there when medically stable. Per Thayer Ohm at facility, pt is nursing level of care and okay to return. At baseline, pt is a limited assist with ADLs and uses a wheelchair.   Employment status:  Retired Health and safety inspector:  Medicare PT Recommendations:  Not assessed at this time Information / Referral to community resources:  Other (Comment Required) (return to [facility])  Patient/Family's Response to care:  Pt's son requests return to [facility] at d/c.   Patient/Family's Understanding of and Emotional Response to Diagnosis, Current Treatment, and Prognosis:  Pt's son aware of admission diagnosis and plans to  get update when he arrives at hospital this afternoon. He shared that pt has been more confused recently and he is hoping she will return to baseline at d/c.   Emotional Assessment Appearance:  Appears stated age Attitude/Demeanor/Rapport:  Unable to Assess Affect (typically observed):  Unable to Assess Orientation:  Oriented to Self Alcohol / Substance use:  Not Applicable Psych involvement (Current and /or in the community):  No (Comment)  Discharge Needs  Concerns to be addressed:  Discharge Planning Concerns Readmission within the last 30 days:  No Current discharge risk:  Cognitively Impaired Barriers to Discharge:  Continued Medical Work up   [employer], [employer], LCSW 01/25/2015, 2:18 PM [phone]

## 2015-01-25 NOTE — Care Management Note (Signed)
Case Management Note  Patient Details  Name: Ashley Singh MRN: [id number] Date of Birth: 03/23/1915  Subjective/Objective:                  Pt from [facility], SNF, Admitted and tx for HCAP. Plan for return to facility at DC.   Action/Plan: CSW is aware of DC plan and will work with pt/family and arrange for return to SNF at DC. No CM needs noted.   Expected Discharge Date:     01/27/2015             Expected Discharge Plan:  Skilled Nursing Facility  In-House Referral:  Clinical Social Work  Discharge planning Services  CM Consult  Post Acute Care Choice:  NA Choice offered to:  NA  DME Arranged:    DME Agency:     HH Arranged:    HH Agency:     Status of Service:  Completed, signed off  Medicare Important Message Given:    Date Medicare IM Given:    Medicare IM give by:    Date Additional Medicare IM Given:    Additional Medicare Important Message give by:     If discussed at Long Length of Stay Meetings, dates discussed:    Additional Comments:  Malcolm Metro, RN 01/25/2015, 11:18 AM

## 2015-01-26 DIAGNOSIS — J189 Pneumonia, unspecified organism: Principal | ICD-10-CM

## 2015-01-26 DIAGNOSIS — N189 Chronic kidney disease, unspecified: Secondary | ICD-10-CM

## 2015-01-26 DIAGNOSIS — E440 Moderate protein-calorie malnutrition: Secondary | ICD-10-CM | POA: Diagnosis present

## 2015-01-26 DIAGNOSIS — D631 Anemia in chronic kidney disease: Secondary | ICD-10-CM | POA: Insufficient documentation

## 2015-01-26 DIAGNOSIS — F0390 Unspecified dementia without behavioral disturbance: Secondary | ICD-10-CM

## 2015-01-26 LAB — BASIC METABOLIC PANEL
Anion gap: 7 (ref 5–15)
BUN: 48 mg/dL — AB (ref 6–20)
CALCIUM: 8.3 mg/dL — AB (ref 8.9–10.3)
CO2: 18 mmol/L — AB (ref 22–32)
CREATININE: 1.8 mg/dL — AB (ref 0.44–1.00)
Chloride: 116 mmol/L — ABNORMAL HIGH (ref 101–111)
GFR calc Af Amer: 26 mL/min — ABNORMAL LOW (ref >60)
GFR, EST NON AFRICAN AMERICAN: 22 mL/min — AB (ref >60)
GLUCOSE: 109 mg/dL — AB (ref 65–99)
Potassium: 4.5 mmol/L (ref 3.5–5.1)
Sodium: 141 mmol/L (ref 135–145)

## 2015-01-26 LAB — CBC
HEMATOCRIT: 44.3 % (ref 36.0–46.0)
Hemoglobin: 14.4 g/dL (ref 12.0–15.0)
MCH: 32.4 pg (ref 26.0–34.0)
MCHC: 32.5 g/dL (ref 30.0–36.0)
MCV: 99.6 fL (ref 78.0–100.0)
PLATELETS: 95 10*3/uL — AB (ref 150–400)
RBC: 4.45 MIL/uL (ref 3.87–5.11)
RDW: 15.7 % — AB (ref 11.5–15.5)
WBC: 5.1 10*3/uL (ref 4.0–10.5)

## 2015-01-26 LAB — LACTIC ACID, PLASMA: Lactic Acid, Venous: 2.4 mmol/L (ref 0.5–2.0)

## 2015-01-26 MED ORDER — AMOXICILLIN-POT CLAVULANATE 500-125 MG PO TABS: 1.0000 | ORAL_TABLET | Freq: Three times a day (TID) | ORAL | Status: AC

## 2015-01-26 NOTE — Progress Notes (Signed)
1500 Transportation from [facility] arrived to pick up patient. IV catheter removed from LEFT hand, catheter intact, no s/s of infection, patient tolerated well w/no c/o pain or discomfort noted. Telemetry monitor and wiring removed from patient and central telemetry notified and made aware. Patient bathed and assisted with clothing change by staff prior to d/c. [facility] notified and report on patient given to nurse [facility].

## 2015-01-26 NOTE — Care Management Note (Signed)
Case Management Note  Patient Details  Name: Clarinda Obi MRN: [id number] Date of Birth: 06-21-1914  Expected Discharge Date:                  Expected Discharge Plan:  Skilled Nursing Facility  In-House Referral:  Clinical Social Work  Discharge planning Services  CM Consult  Post Acute Care Choice:  NA Choice offered to:  NA  DME Arranged:    DME Agency:     HH Arranged:    HH Agency:     Status of Service:  Completed, signed off  Medicare Important Message Given:  N/A - LOS <3 / Initial given by admissions Date Medicare IM Given:    Medicare IM give by:    Date Additional Medicare IM Given:    Additional Medicare Important Message give by:     If discussed at Long Length of Stay Meetings, dates discussed:    Additional Comments: Pt discharging to SNF today. CSW has arranged for return to facility. No CM needs.  Malcolm Metro, RN 01/26/2015, 11:02 AM

## 2015-01-26 NOTE — Discharge Summary (Signed)
Physician Discharge Summary  Ashley Singh ONG:[id number] DOB: 07/04/1914 DOA: 01/24/2015  PCP: No primary care provider on file.  Admit date: 01/24/2015 Discharge date: 01/26/2015  Time spent: 40 minutes  Recommendations for Outpatient Follow-up:  1. PCP in 1-2 weeks for evaluation of resolution of UTI 2. Discharging to [facility]  Discharge Diagnoses:  Principal Problem:   Acute respiratory failure with hypoxia Active Problems:   HCAP (healthcare-associated pneumonia)   Encephalopathy   Atrial fibrillation   Dementia   Hypertension   Renal disorder   Hyperkalemia   Thrombocytopenia   Hematochezia   Acute renal failure syndrome   Malnutrition of moderate degree   Discharge Condition: stable  Diet recommendation: heart healthy  Filed Weights   01/24/15 1035 01/24/15 1517  Weight: 43.092 kg (95 lb) 43.817 kg (96 lb 9.6 oz)    History of present illness:  Ashley Singh is a 81 y.o. female with a past medical history that includes A. fib, hypertension, dementia, anemia presented to the emergency department on 01/24/15 with the chief complaint encephalopathy. Initial evaluation in the emergency department revealed healthcare associated pneumonia, urinary tract infection, acute respiratory failure.  Patient is a resident of long-term nursing facility [facility]. The day prior to presentation she was diagnosed with a urinary tract infection and antibiotic tarted. That morning patient was much more lethargic than usual found to be incontinent of bowel which is not her norm. Information stated some blood was noted in the stool EMS was called and her oxygen saturation level was in the 80s. There is no report of recent fever chills nausea vomiting diarrhea. No report of any complaints of pain however the patient was unable to make her wants and needs known. There had been been no shortness of breath no coughing. Workup in the emergency department included a chest x-ray  suspicious for pneumonia/atelectasis/pleural effusion. Lab work significant for potassium of 5.2 creatinine 1.79 platelets 98, lactic acid 1.62, urineanalysis consistent with UTI, FOBT positive.  Hospital Course:  Acute respiratory failure with hypoxia: Secondary to increased lethargy in the setting of healthcare associated pneumonia and urinary tract infection and dehydration. Resolved at discharge.  Oxygen saturation level 96% on room air at discharge.     Encephalopathy: Acute on chronic secondary to infectious process in the setting of dementia. At baseline at discharge. Blood culture no growth. Remained afebrile and nontoxic appearing will monitor  Active Problems:   HCAP (healthcare-associated pneumonia): Per chest x-ray. See #1. vancomycin and Zosyn.Remained afebrile nontoxic appearing and hemodynamically stable. Augmentin at discharge.   CKD II: stable. Frequently incontinent of urine    Hyperkalemia: Mild. Resolved. .   Thrombocytopenia: etiology unclear. no signs symptoms of bleeding. Hg 14.3.    Atrial fibrillation: EKG was normal sinus rhythm. Home medications include IIsordil, metoprolol. will resume. Currently on Plavix in spite of reports of recent falling. Will resume.   Dementia: See #2.   Hypertension: controlled. .  Hematochezia: Facility reports blood in stool. FOBT positive. Hemoglobin is stable. Will monitor. Consider outpatient follow-up   Procedures:  none  Consultations:  none  Discharge Exam: Filed Vitals:   01/26/15 0601  BP: 148/75  Pulse: 84  Temp: 97.1 F (36.2 C)  Resp: 20    General: thin frail alert smiling Cardiovascular: RRR no MGR no LE edema Respiratory: normal effort BS with good air flow no wheeze  Discharge Instructions   Discharge Instructions    Diet - low sodium heart healthy    Complete by:  As directed      Increase activity slowly    Complete by:  As directed           Current Discharge Medication List     START taking these medications   Details  amoxicillin-clavulanate (AUGMENTIN) 500-125 MG tablet Take 1 tablet (500 mg total) by mouth 3 (three) times daily. Qty: 15 tablet, Refills: 0      CONTINUE these medications which have NOT CHANGED   Details  acetaminophen (TYLENOL) 500 MG tablet Take 500 mg by mouth 3 (three) times daily.    carboxymethylcellulose (REFRESH TEARS) 0.5 % SOLN Place 1 drop into both eyes every 8 (eight) hours as needed (FOR DRYNESS).    clopidogrel (PLAVIX) 75 MG tablet Take 75 mg by mouth daily.    escitalopram (LEXAPRO) 5 MG tablet Take 5 mg by mouth daily.    estradiol (ESTRACE) 0.1 MG/GM vaginal cream Place 1 Applicatorful vaginally every evening.    feeding supplement, ENSURE ENLIVE, (ENSURE ENLIVE) LIQD Take 120 mLs by mouth 3 (three) times daily.    ibuprofen (ADVIL,MOTRIN) 400 MG tablet Take 400 mg by mouth every 6 (six) hours as needed for mild pain or moderate pain.    isosorbide dinitrate (ISORDIL) 10 MG tablet Take 10 mg by mouth 2 (two) times daily.    lidocaine (LINDAMANTLE) 3 % CREA cream Apply 1 application topically daily as needed (APPLIED TO VUVLA  AS NEEDED FOR ITCHING).    Melatonin 3 MG TABS Take 3 mg by mouth daily as needed (FOR INSOMNIA).    metoprolol succinate (TOPROL-XL) 25 MG 24 hr tablet Take 12.5 mg by mouth daily.    mirtazapine (REMERON) 7.5 MG tablet Take 7.5 mg by mouth at bedtime.    Multiple Vitamin (MULTIVITAMIN WITH MINERALS) TABS tablet Take 1 tablet by mouth daily.    polyethylene glycol powder (GLYCOLAX/MIRALAX) powder Take 17 g by mouth daily.    tetrahydrozoline 0.05 % ophthalmic solution Place 2 drops into both eyes 4 (four) times daily.      STOP taking these medications     cefTRIAXone (ROCEPHIN) 500 MG injection        No Known Allergies Follow-up Information    Schedule an appointment as soon as possible for a visit in 1 week to follow up.   Why:  evaluate resolution of UTI       The  results of significant diagnostics from this hospitalization (including imaging, microbiology, ancillary and laboratory) are listed below for reference.    Significant Diagnostic Studies: Dg Chest Portable 1 View  01/24/2015   CLINICAL DATA:  Found down today. Dementia with GI bleeding and urinary tract infection. Initial encounter.  EXAM: PORTABLE CHEST 1 VIEW  COMPARISON:  None.  FINDINGS: 1157 hours. The lung bases are excluded from this portable examination. Patient is rotated to the right. The heart is enlarged and there is mild aortic atherosclerosis and ectasia. There are probable bilateral pleural effusions with asymmetric perihilar airspace disease on the right. No evidence of pneumothorax. The bones are demineralized without acute findings.  IMPRESSION: Asymmetric perihilar airspace disease on the right may reflect pneumonia or focal atelectasis. Suspected bilateral pleural effusions with associated basilar atelectasis and mild cardiomegaly. Followup PA and lateral chest X-ray is recommended in 3-4 weeks following trial of antibiotic therapy to ensure resolution and exclude underlying malignancy.   Electronically Signed   By: Carey Bullocks M.D.   On: 01/24/2015 12:11    Microbiology: Recent Results (from the past 240  hour(s))  Urine culture     Status: None   Collection Time: 01/24/15 12:55 PM  Result Value Ref Range Status   Specimen Description URINE, CATHETERIZED  Final   Special Requests NONE  Final   Culture   Final    MULTIPLE SPECIES PRESENT, SUGGEST RECOLLECTION Performed at [facility]    Report Status 01/25/2015 FINAL  Final  Culture, blood (routine x 2) Call MD if unable to obtain prior to antibiotics being given     Status: None (Preliminary result)   Collection Time: 01/24/15  4:05 PM  Result Value Ref Range Status   Specimen Description BLOOD LEFT ANTECUBITAL  Final   Special Requests BOTTLES DRAWN AEROBIC ONLY 4CC  Final   Culture NO GROWTH < 24 HOURS  Final    Report Status PENDING  Incomplete  Culture, blood (routine x 2) Call MD if unable to obtain prior to antibiotics being given     Status: None (Preliminary result)   Collection Time: 01/24/15  4:12 PM  Result Value Ref Range Status   Specimen Description BLOOD RIGHT ANTECUBITAL  Final   Special Requests BOTTLES DRAWN AEROBIC ONLY 4CC  Final   Culture NO GROWTH < 24 HOURS  Final   Report Status PENDING  Incomplete  MRSA PCR Screening     Status: None   Collection Time: 01/25/15  2:40 AM  Result Value Ref Range Status   MRSA by PCR NEGATIVE NEGATIVE Final    Comment:        The GeneXpert MRSA Assay (FDA approved for NASAL specimens only), is one component of a comprehensive MRSA colonization surveillance program. It is not intended to diagnose MRSA infection nor to guide or monitor treatment for MRSA infections.      Labs: Basic Metabolic Panel:  Recent Labs Lab 01/24/15 1111 01/25/15 0743 01/26/15 0536  NA 139 140 141  K 5.2* 4.9 4.5  CL 110 114* 116*  CO2 20* 17* 18*  GLUCOSE 94 83 109*  BUN 49* 49* 48*  CREATININE 1.79* 1.78* 1.80*  CALCIUM 8.7* 8.2* 8.3*   Liver Function Tests:  Recent Labs Lab 01/24/15 1111 01/25/15 0743  AST 37 28  ALT 23 17  ALKPHOS 102 80  BILITOT 1.1 1.4*  PROT 7.1 6.3*  ALBUMIN 3.3* 2.9*   No results for input(s): LIPASE, AMYLASE in the last 168 hours. No results for input(s): AMMONIA in the last 168 hours. CBC:  Recent Labs Lab 01/24/15 1111 01/25/15 0743 01/26/15 0536  WBC 4.6 3.9* 5.1  NEUTROABS 3.1  --   --   HGB 14.5 14.3 14.4  HCT 44.8 44.2 44.3  MCV 99.1 100.0 99.6  PLT 98* 86* 95*   Cardiac Enzymes: No results for input(s): CKTOTAL, CKMB, CKMBINDEX, TROPONINI in the last 168 hours. BNP: BNP (last 3 results) No results for input(s): BNP in the last 8760 hours.  ProBNP (last 3 results) No results for input(s): PROBNP in the last 8760 hours.  CBG: No results for input(s): GLUCAP in the last 168  hours.     SignedGwenyth Bender  [facility] 01/26/2015, 10:11 AM

## 2015-01-26 NOTE — Care Management Important Message (Signed)
Important Message  Patient Details  Name: Ashley Singh MRN: [id number] Date of Birth: 1914/08/28   Medicare Important Message Given:  N/A - LOS <3 / Initial given by admissions    Malcolm Metro, RN 01/26/2015, 11:02 AM

## 2015-01-26 NOTE — Progress Notes (Signed)
Critical Lactic Acid 2.4.  P. Le notified. Will pass to oncoming nurse.

## 2015-01-26 NOTE — Clinical Social Work Note (Signed)
Pt d/c today back to [facility]. Pt's son, Chrissie Noa and facility aware and agreeable. CSW will fax d/c summary. Facility to provide transport.  Derenda Fennel, LCSW [phone]

## 2015-01-29 LAB — CULTURE, BLOOD (ROUTINE X 2)
CULTURE: NO GROWTH
Culture: NO GROWTH

## 2015-03-30 DEATH — deceased

## 2017-01-18 IMAGING — CR DG CHEST 1V PORT
1 series · 1 of 1 positions shown · non-contrast
Comparison: None.

CLINICAL DATA: Found down today. Dementia with GI bleeding and
urinary tract infection. Initial encounter.

EXAM:
PORTABLE CHEST 1 VIEW

[ap portable]
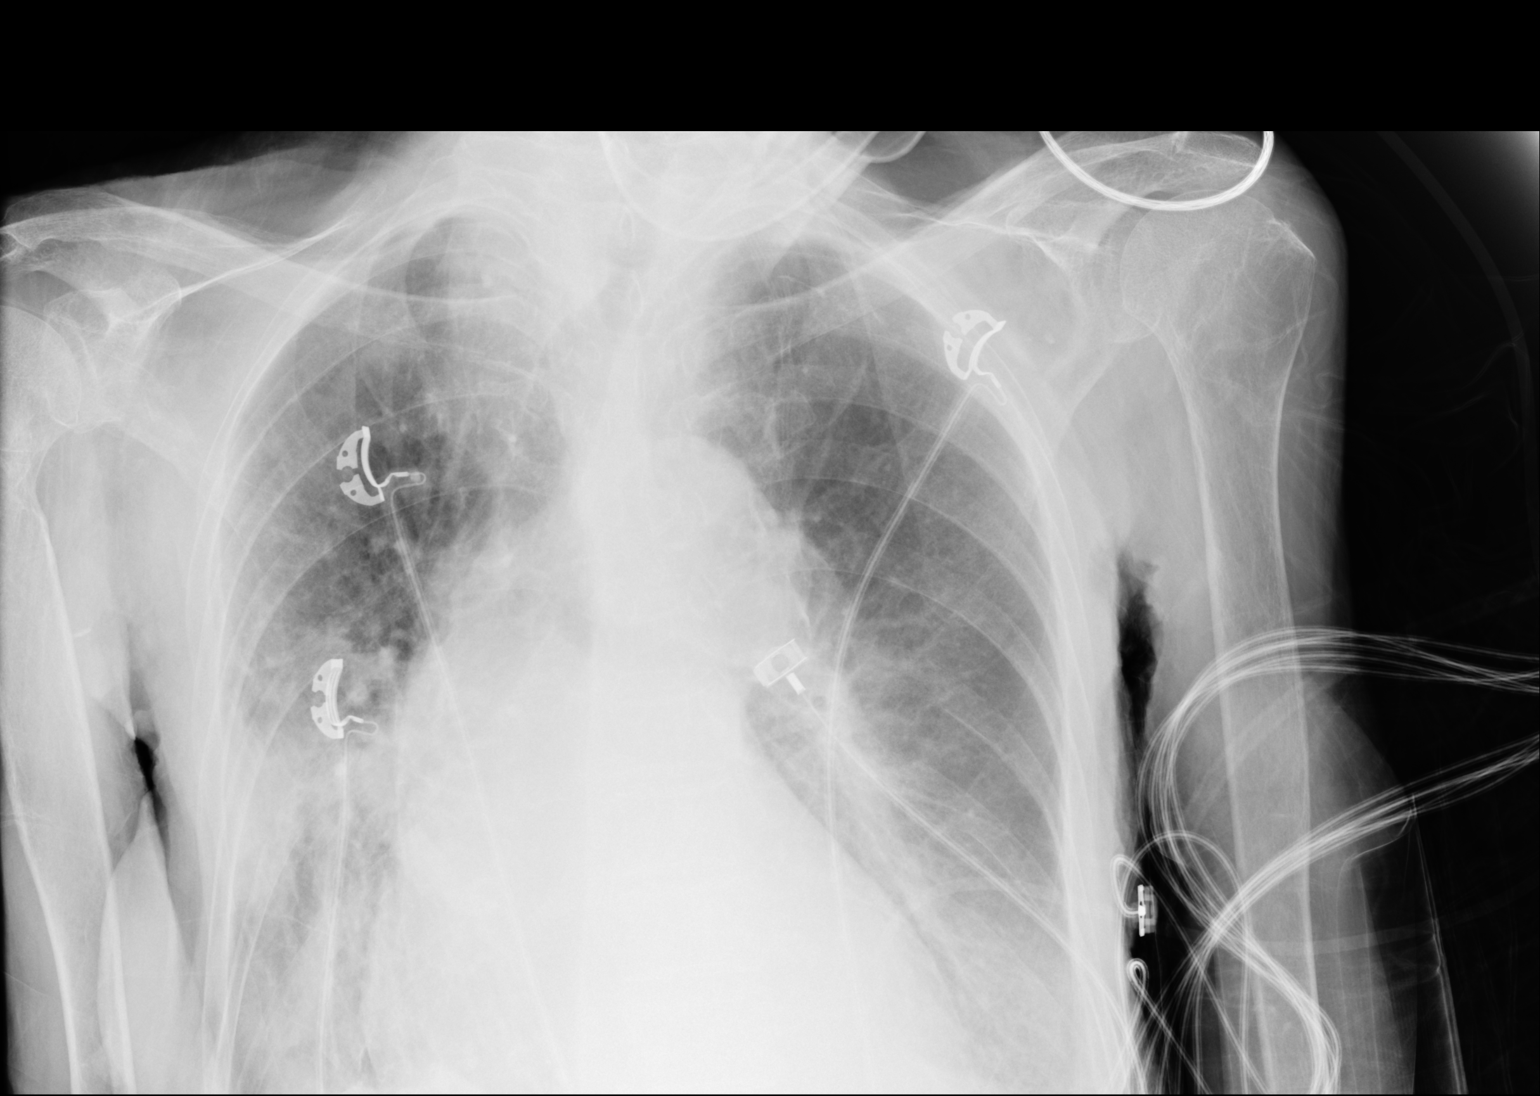

[1 of 1 positions shown; findings below may reference images not displayed]

FINDINGS: 1198 hours. The lung bases are excluded from this portable
examination. Patient is rotated to the right. The heart is enlarged
and there is mild aortic atherosclerosis and ectasia. There are
probable bilateral pleural effusions with asymmetric perihilar
airspace disease on the right. No evidence of pneumothorax. The
bones are demineralized without acute findings.
IMPRESSION: Asymmetric perihilar airspace disease on the right may reflect
pneumonia or focal atelectasis. Suspected bilateral pleural
effusions with associated basilar atelectasis and mild cardiomegaly.
Followup PA and lateral chest X-ray is recommended in 3-4 weeks
following trial of antibiotic therapy to ensure resolution and
exclude underlying malignancy.
